# Patient Record
Sex: Male | Born: 1995 | Race: White | Hispanic: No | Marital: Single | Smoking: Current every day smoker
Health system: Southern US, Community
[De-identification: ages and names within clinical notes are randomized; demographics above are authoritative.]

## PROBLEM LIST (undated history)

## (undated) HISTORY — PX: FRACTURE SURGERY: SHX138

---
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
## 2014-11-20 ENCOUNTER — Ambulatory Visit: Payer: Self-pay | Admitting: Physician Assistant

## 2014-11-20 LAB — RAPID STREP-A WITH REFLX: MICRO TEXT REPORT: NEGATIVE

## 2014-11-23 LAB — BETA STREP CULTURE(ARMC)

## 2017-05-05 ENCOUNTER — Ambulatory Visit (INDEPENDENT_AMBULATORY_CARE_PROVIDER_SITE_OTHER): Payer: 59 | Admitting: Family Medicine

## 2017-05-05 ENCOUNTER — Encounter: Payer: Self-pay | Admitting: Family Medicine

## 2017-05-05 VITALS — BP 124/72 | HR 64 | Ht 74.0 in | Wt 220.0 lb

## 2017-05-05 DIAGNOSIS — R454 Irritability and anger: Secondary | ICD-10-CM | POA: Diagnosis not present

## 2017-05-05 MED ORDER — SERTRALINE HCL 25 MG PO TABS: 25.0000 mg | ORAL_TABLET | Freq: Every day | ORAL | 1 refills | Status: DC

## 2017-05-05 NOTE — Progress Notes (Addendum)
Name: Brent Valencia   MRN: [id number]    DOB: 07/24/1996   Date:05/05/2017       Progress Note  Subjective  Chief Complaint  Chief Complaint  Patient presents with  . Establish Care  . anger issues    "little things get under my skin and I can't calm down until something happens"- getting worse    Depression         The patient presents with no depression.  This is a new problem.  The current episode started more than 1 year ago.   The onset quality is undetermined.   The problem occurs intermittently.  The problem has been waxing and waning since onset.  Associated symptoms include decreased concentration, insomnia, irritable, restlessness, decreased interest and sad.  Associated symptoms include no fatigue, no helplessness, no hopelessness, no appetite change, no body aches, no myalgias, no headaches, no indigestion and no suicidal ideas.  Past treatments include SSRIs - Selective serotonin reuptake inhibitors.   Pertinent negatives include no thyroid problem, no anxiety, no depression, no mental health disorder, no obsessive-compulsive disorder and no post-traumatic stress disorder.   No problem-specific Assessment & Plan notes found for this encounter.   History reviewed. No pertinent past medical history.  Past Surgical History:  Procedure Laterality Date  . MANDIBLE FRACTURE SURGERY  2014    Family History  Problem Relation Age of Onset  . Stroke Maternal Grandmother   . Stroke Paternal Grandmother   . Cancer Paternal Grandfather   . Heart disease Paternal Grandfather     Social History   Social History  . Marital status: Single    Spouse name: N/A  . Number of children: N/A  . Years of education: N/A   Occupational History  . Not on file.   Social History Main Topics  . Smoking status: Current Every Day Smoker    Types: Cigarettes  . Smokeless tobacco: Never Used     Comment: info given  . Alcohol use Not on file  . Drug use: Unknown  . Sexual  activity: Not on file   Other Topics Concern  . Not on file   Social History Narrative  . No narrative on file    Not on File  No outpatient prescriptions prior to visit.   No facility-administered medications prior to visit.     Review of Systems  Constitutional: Negative for appetite change, chills, fatigue, fever, malaise/fatigue and weight loss.  HENT: Negative for ear discharge, ear pain and sore throat.   Eyes: Negative for blurred vision.  Respiratory: Negative for cough, sputum production, shortness of breath and wheezing.   Cardiovascular: Negative for chest pain, palpitations and leg swelling.  Gastrointestinal: Negative for abdominal pain, blood in stool, constipation, diarrhea, heartburn, melena and nausea.  Genitourinary: Negative for dysuria, frequency, hematuria and urgency.  Musculoskeletal: Negative for back pain, joint pain, myalgias and neck pain.  Skin: Negative for rash.  Neurological: Negative for dizziness, tingling, sensory change, focal weakness and headaches.  Endo/Heme/Allergies: Negative for environmental allergies and polydipsia. Does not bruise/bleed easily.  Psychiatric/Behavioral: Positive for decreased concentration and depression. Negative for suicidal ideas. The patient has insomnia. The patient is not nervous/anxious.      Objective  Vitals:   05/05/17 1446  BP: 124/72  Pulse: 64  Weight: 220 lb (99.8 kg)  Height: 6\' 2"  (1.88 m)    Physical Exam  Constitutional: He is oriented to person, place, and time and well-developed, well-nourished, and in no distress. He  is irritable.  HENT:  Head: Normocephalic.  Right Ear: External ear normal.  Left Ear: External ear normal.  Nose: Nose normal.  Mouth/Throat: Oropharynx is clear and moist.  Eyes: Pupils are equal, round, and reactive to light. Conjunctivae and EOM are normal. Right eye exhibits no discharge. Left eye exhibits no discharge. No scleral icterus.  Neck: Normal range of  motion. Neck supple. No JVD present. No tracheal deviation present. No thyromegaly present.  Cardiovascular: Normal rate, regular rhythm, normal heart sounds and intact distal pulses.  Exam reveals no gallop and no friction rub.   No murmur heard. Pulmonary/Chest: Breath sounds normal. No respiratory distress. He has no wheezes. He has no rales.  Abdominal: Soft. Bowel sounds are normal. He exhibits no mass. There is no hepatosplenomegaly. There is no tenderness. There is no rebound, no guarding and no CVA tenderness.  Musculoskeletal: Normal range of motion. He exhibits no edema or tenderness.  Lymphadenopathy:    He has no cervical adenopathy.  Neurological: He is alert and oriented to person, place, and time. He has normal sensation, normal strength, normal reflexes and intact cranial nerves. No cranial nerve deficit.  Skin: Skin is warm. No rash noted.  Psychiatric: Mood and affect normal.  Nursing note and vitals reviewed.     Assessment & Plan  Problem List Items Addressed This Visit    None    Visit Diagnoses    Difficulty controlling anger    -  Primary   Relevant Medications   sertraline (ZOLOFT) 25 MG tablet      Meds ordered this encounter  Medications  . sertraline (ZOLOFT) 25 MG tablet    Sig: Take 1 tablet (25 mg total) by mouth daily.    Dispense:  30 tablet    Refill:  1      Dr. Hayden [facility]  05/05/17

## 2017-06-02 ENCOUNTER — Ambulatory Visit (INDEPENDENT_AMBULATORY_CARE_PROVIDER_SITE_OTHER): Payer: 59 | Admitting: Family Medicine

## 2017-06-02 ENCOUNTER — Encounter: Payer: Self-pay | Admitting: Family Medicine

## 2017-06-02 VITALS — BP 118/78 | HR 56 | Ht 74.0 in | Wt 223.0 lb

## 2017-06-02 DIAGNOSIS — R454 Irritability and anger: Secondary | ICD-10-CM

## 2017-06-02 MED ORDER — SERTRALINE HCL 50 MG PO TABS: 50.0000 mg | ORAL_TABLET | Freq: Every day | ORAL | 3 refills | Status: DC

## 2017-06-02 NOTE — Progress Notes (Signed)
Name: Brent Valencia   MRN: [id number]    DOB: 09-23-1995   Date:06/02/2017       Progress Note  Subjective  Chief Complaint  Chief Complaint  Patient presents with  . Follow-up    anger- started sertraline  qday-  has not noticed any difference    Depression         The patient presents with no depression.  This is a recurrent (irratabilty) problem.  The current episode started more than 1 year ago.   The onset quality is gradual.   The problem has been gradually improving since onset.  Associated symptoms include irritable and sad.  Associated symptoms include no decreased concentration, no helplessness, no hopelessness, does not have insomnia, no decreased interest, no myalgias, no headaches and no suicidal ideas.     The symptoms are aggravated by work stress.  Past treatments include SSRIs - Selective serotonin reuptake inhibitors.  Compliance with treatment is good.  Previous treatment provided mild relief.   Pertinent negatives include no fibromyalgia, no anxiety, no depression, no obsessive-compulsive disorder and no post-traumatic stress disorder.   No problem-specific Assessment & Plan notes found for this encounter.   No past medical history on file.  Past Surgical History:  Procedure Laterality Date  . MANDIBLE FRACTURE SURGERY  2014    Family History  Problem Relation Age of Onset  . Stroke Maternal Grandmother   . Stroke Paternal Grandmother   . Cancer Paternal Grandfather   . Heart disease Paternal Grandfather     Social History   Social History  . Marital status: Single    Spouse name: N/A  . Number of children: N/A  . Years of education: N/A   Occupational History  . Not on file.   Social History Main Topics  . Smoking status: Current Every Day Smoker    Types: Cigarettes  . Smokeless tobacco: Never Used     Comment: info given  . Alcohol use Not on file  . Drug use: Unknown  . Sexual activity: Not on file   Other Topics Concern  . Not  on file   Social History Narrative  . No narrative on file    No Known Allergies  Outpatient Medications Prior to Visit  Medication Sig Dispense Refill  . sertraline (ZOLOFT) 25 MG tablet Take 1 tablet (25 mg total) by mouth daily. 30 tablet 1   No facility-administered medications prior to visit.     Review of Systems  Constitutional: Negative for chills, fever, malaise/fatigue and weight loss.  HENT: Negative for ear discharge, ear pain and sore throat.   Eyes: Negative for blurred vision.  Respiratory: Negative for cough, sputum production, shortness of breath and wheezing.   Cardiovascular: Negative for chest pain, palpitations and leg swelling.  Gastrointestinal: Negative for abdominal pain, blood in stool, constipation, diarrhea, heartburn, melena and nausea.  Genitourinary: Negative for dysuria, frequency, hematuria and urgency.  Musculoskeletal: Negative for back pain, joint pain, myalgias and neck pain.  Skin: Negative for rash.  Neurological: Negative for dizziness, tingling, sensory change, focal weakness and headaches.  Endo/Heme/Allergies: Negative for environmental allergies and polydipsia. Does not bruise/bleed easily.  Psychiatric/Behavioral: Positive for depression. Negative for decreased concentration and suicidal ideas. The patient is not nervous/anxious and does not have insomnia.      Objective  Vitals:   06/02/17 1548  BP: 118/78  Pulse: (!) 56  Weight: 223 lb (101.2 kg)  Height:  (1.88 m)    Physical Exam  Constitutional: He is oriented to person, place, and time and well-developed, well-nourished, and in no distress. He is irritable.  HENT:  Head: Normocephalic.  Right Ear: External ear normal.  Left Ear: External ear normal.  Nose: Nose normal.  Mouth/Throat: Oropharynx is clear and moist.  Eyes: Pupils are equal, round, and reactive to light. Conjunctivae and EOM are normal. Right eye exhibits no discharge. Left eye exhibits no  discharge. No scleral icterus.  Neck: Normal range of motion. Neck supple. No JVD present. No tracheal deviation present. No thyromegaly present.  Cardiovascular: Normal rate, regular rhythm, normal heart sounds and intact distal pulses.  Exam reveals no gallop and no friction rub.   No murmur heard. Pulmonary/Chest: Breath sounds normal. No respiratory distress. He has no wheezes. He has no rales.  Abdominal: Soft. Bowel sounds are normal. He exhibits no mass. There is no hepatosplenomegaly. There is no tenderness. There is no rebound, no guarding and no CVA tenderness.  Musculoskeletal: Normal range of motion. He exhibits no edema or tenderness.  Lymphadenopathy:    He has no cervical adenopathy.  Neurological: He is alert and oriented to person, place, and time. He has normal sensation, normal strength, normal reflexes and intact cranial nerves. No cranial nerve deficit.  Skin: Skin is warm. No rash noted.  Psychiatric: Mood and affect normal.  Nursing note and vitals reviewed.     Assessment & Plan  Problem List Items Addressed This Visit      Other   Difficulty controlling anger - Primary      Meds ordered this encounter  Medications  . sertraline (ZOLOFT) 50 MG tablet    Sig: Take 1 tablet (50 mg total) by mouth daily. One and a half  A day    Dispense:  30 tablet    Refill:  3      Dr. [facility]  06/02/17

## 2017-06-02 NOTE — Patient Instructions (Signed)
Intermittent Explosive Disorder  Intermittent explosive disorder is a mental illness. With this disorder, you have trouble controlling angry impulses. You repeatedly have sudden episodes of aggression (explosive rages). The rages are unplanned and out of proportion to the situation. They can result in serious injury to people and animals or damage to property. Intermittent explosive disorder may interfere with personal relationships, school, or work. It may cause legal or financial problems. Some people with intermittent explosive disorder feel guilty or embarrassed after a rage.  Intermittent explosive disorder is not diagnosed before age 6 years and most commonly starts in the teenage years. The disorder may go away after 10 or 20 years or may last for life. It is more common in men than women. This disorder is also more common in people with a history of physical abuse or with family members who have the disorder. With intermittent explosive disorder, you may often have one or more other mental disorders, including other impulse control disorders, depression, anxiety, and substance abuse. You are also at risk for self-injury and suicide attempts.  What are the signs or symptoms?  Explosive rages may be verbal, physical, or both. Examples of verbal rages may include:  · Temper tantrums after the age of 6 years.  · Verbal arguments or fights.  · Vicious name calling, cursing, or insulting others.  · Road rage.    Physical rages are directed at people, animals, or property. They may or may not cause injury or damage. Examples of physical aggression may include:  · Pushing, slapping, punching, choking, or kicking.  · Punching holes in walls, breaking furniture, or throwing items.    Verbal rages and nondamaging physical rages occur twice a week on average for 3 months or longer. Rages causing physical injury or property damage occur at least three times within a 12-month period.  Rages usually last less than 30  minutes. In between rages, you may have no signs of anger, or you may be irritable. Before or during an episode you may have one of the following sensations:  · Tingling.  · Tremors.  · Pounding heart.  · Chest tightness.  · Head pressure.  · Hearing an echo.    How is this diagnosed?  Intermittent explosive disorder is diagnosed through an assessment by your health care provider. He or she will ask questions about your rages and how they affect your life. Your health care provider may also ask about:  · Your mood.  · Your thoughts.  · Your other behaviors.  · Your medical history.  · Your use of medicines or drugs.    Your health care provider may do a physical exam and order lab tests or brain imaging studies. Similar symptoms may occur with a number of other mental illnesses, certain brain disorders, and the use of certain substances. Your health care provider may refer you to a mental health professional for evaluation.  How is this treated?  Treatment for intermittent explosive disorder is provided by mental health specialists. It includes the following options:  · Counseling or talk therapy. One form of talk therapy focuses on your underlying feelings and reasons that lead to rages. Another form of talk therapy looks at your triggers for rages and ways to prevent or control them.  · Support groups. These provide emotional support and advice from other people with anger management problems.  · Medicines. Several different types of medicine may help reduce the frequency and severity of explosive rages. These include certain   antidepressants, mood stabilizers, anticonvulsants, major tranquilizers (antipsychotics), and beta blockers.    The most effective treatment is usually a combination of counseling, group support, and medicine.  Follow these instructions at home:  · When possible, leave or avoid situations that upset you.  · Keep all follow-up visits as directed by your health care provider.  · Take medicines  only as directed by your health care provider.  Contact a health care provider if:  · Your symptoms get worse.  · You are not able to take your medicine as told.  Get help right away if:  · You have serious thoughts about hurting yourself or others.  · You have seriously injured yourself or someone else.  This information is not intended to replace advice given to you by your health care provider. Make sure you discuss any questions you have with your health care provider.  Document Released: 11/22/2007 Document Revised: 01/21/2016 Document Reviewed: 10/11/2013  Elsevier Interactive Patient Education © 2018 [employer]        Tips for Managing Your Anger  HOW CAN ANGER AFFECT MY LIFE?  Everyone feels angry from time to time. It is okay and normal to feel angry. However, the way that you behave or react to anger can make it a problem. If you react too strongly to anger or you cannot control your anger, that can cause relationships problems at home and work.  Anger can also affect your health. Uncontrolled anger increases your risk of heart disease. When you are angry, your heart rate and blood pressure rise. Levels of certain energy hormones, such as adrenaline, also increase. When this happens, your heart has to work harder. In extreme cases, anger can cause the blood vessels to become narrow. This reduces the supply of blood and oxygen to the heart, and that can trigger chest pain (angina).  Anger can also trigger stress-related problems, such as:  · Headaches.  · Poor digestion.  · Trouble sleeping (insomnia).    WHAT ACTIONS CAN I TAKE TO HELP MANAGE MY ANGER?  You can take actions to help you manage your anger. For example:  · Express your anger. When you express your anger in a healthy way, it is a form of communication. The following strategies can help you to express your anger in a healthy way and when you are ready to do so:  ? Step away. When you are feeling reactive, it may take at least 20 minutes for  your body to return to its normal blood pressure and heart rate. To help your body do this, take a walk, listen to music, stretch, take deep breaths, and avoid the situation or person who is making you angry. Try to only discuss your anger when you feel calm again.  ? Try to consider how others feel before you react. Avoid swearing, sighing, raising your voice, or blaming.  ? Choose a good time to work through problems. You may be more likely to lose your temper at the end of the day when you are tired.  ? Keep an anger journal. Writing down the situations that make you angry can help you figure out what triggers your anger and why.  · Consider changing your perception. Is there another way you can view the situation that will leave you with a different emotion? Sometimes, changing the way you think about a situation can make it seem less infuriating. Here are some ways to do that:  ? Remind yourself that everyone is   not out to get you.  ? Remind yourself that a disappointing result is not the end of the world.  ? Take steps to solve or prevent the situation that upsets you.  ? Find the humor in an aggravating situation.  ? Deal with the physical effects by taking deep breaths, exercising, or taking a walk.  ? Slowly repeat the word “relax” or another calming phrase.  ? Picture a relaxing image in your mind. Close your eyes and use that image to help you calm yourself.    WHEN SHOULD I SEEK ADDITIONAL HELP?  Anger becomes a problem if it occurs frequently and lasts for long periods of time. You may also need help managing your anger if:  · You use physical force or aggression when you are angry and others feel threatened and fearful.  · You feel that your anger is out of control.  · Anger is interfering with your job.  · Anger is causing problems with your health.  · Anger is causing problems with your relationships.  · Anger is affecting your ability to tolerate normal daily situations, such as sitting in a traffic  jam or waiting in line.  · You treat others disrespectfully.  · You do not trust people around you.    It may help to ask someone you trust whether he or she thinks you show any of these signs. Sometimes, it can be hard to recognize the problem yourself.  WHERE CAN I GET SUPPORT?  A psychologist or another licensed mental health professional can help you learn how to manage your anger. Ask your health care provider for a referral, or look online to find a psychologist who specializes in anger management. You can search the websites of many mental health organizations to find a mental health care provider. Local Domestic Abuse Projects are also available for help.  Your local hospital or behavioral counselors in your area may also offer anger management programs or support groups that can help.  WHERE CAN I FIND MORE INFORMATION?  · The [place]. Centers for Disease Control and Prevention: www.cdc.gov/bam/life/getting-along3.html  · American Psychological Association: www.apa.org/topics/anger/  · The Substance Abuse and Mental Health Services Administration: http://www.samhsa.gov/samhsaNewsLetter/Volume_22_Number_3/working_with_anger/  · National Resource Center on Domestic Violence: http://www.nrcdv.org/dvam/    This information is not intended to replace advice given to you by your health care provider. Make sure you discuss any questions you have with your health care provider.  Document Released: 06/12/2007 Document Revised: 04/17/2016 Document Reviewed: 06/19/2015  Elsevier Interactive Patient Education © 2018 [employer]

## 2017-06-27 ENCOUNTER — Other Ambulatory Visit: Payer: Self-pay

## 2017-06-27 MED ORDER — SERTRALINE HCL 50 MG PO TABS: 75.0000 mg | ORAL_TABLET | Freq: Every day | ORAL | 3 refills | Status: DC

## 2017-07-14 ENCOUNTER — Ambulatory Visit: Payer: 59 | Admitting: Family Medicine

## 2017-08-18 ENCOUNTER — Other Ambulatory Visit: Payer: Self-pay

## 2017-08-18 MED ORDER — SERTRALINE HCL 50 MG PO TABS: 75.0000 mg | ORAL_TABLET | Freq: Every day | ORAL | 0 refills | Status: DC

## 2017-08-31 MED ORDER — SERTRALINE HCL 50 MG PO TABS: 75.0000 mg | ORAL_TABLET | Freq: Every day | ORAL | 3 refills | Status: DC

## 2017-12-04 ENCOUNTER — Ambulatory Visit: Payer: 59 | Admitting: Family Medicine

## 2017-12-04 ENCOUNTER — Encounter: Payer: Self-pay | Admitting: Family Medicine

## 2017-12-04 VITALS — BP 120/80 | HR 80 | Ht 74.0 in | Wt 225.0 lb

## 2017-12-04 DIAGNOSIS — S93421D Sprain of deltoid ligament of right ankle, subsequent encounter: Secondary | ICD-10-CM

## 2017-12-04 DIAGNOSIS — R454 Irritability and anger: Secondary | ICD-10-CM | POA: Diagnosis not present

## 2017-12-04 MED ORDER — TRAMADOL HCL 50 MG PO TABS: 50.0000 mg | ORAL_TABLET | Freq: Four times a day (QID) | ORAL | 0 refills | Status: DC | PRN

## 2017-12-04 MED ORDER — SERTRALINE HCL 50 MG PO TABS: 75.0000 mg | ORAL_TABLET | Freq: Every day | ORAL | 5 refills | Status: DC

## 2017-12-04 NOTE — Progress Notes (Signed)
Name: Brent Valencia   MRN: [id number]    DOB: 1996/05/04   Date:12/04/2017       Progress Note  Subjective  Chief Complaint  Chief Complaint  Patient presents with  . Ankle Injury    R) ankle- fell down steps/ had an xray- determined no Fx just a sprain  . Depression    follow up on sertraline- "doing good"    Ankle Injury   The incident occurred 3 to 5 days ago. Incident location: friend's house. The injury mechanism was a fall. The pain is present in the right ankle. The quality of the pain is described as aching. The pain is at a severity of 7/10. The pain is moderate. The pain has been intermittent since onset. Associated symptoms include a loss of motion. Pertinent negatives include no inability to bear weight, loss of sensation, muscle weakness, numbness or tingling. The symptoms are aggravated by movement. The treatment provided moderate relief.  Depression         This is a new problem.  The current episode started more than 1 month ago.   The onset quality is gradual.   The problem occurs intermittently.  The problem has been gradually improving since onset.  Associated symptoms include no decreased concentration, no fatigue, no helplessness, no hopelessness, does not have insomnia, not irritable, no restlessness, no decreased interest, no appetite change, no body aches, no myalgias, no headaches, no indigestion, not sad and no suicidal ideas.     The symptoms are aggravated by nothing.  Past treatments include SSRIs - Selective serotonin reuptake inhibitors.  Compliance with treatment is good.  Past compliance problems include difficulty with treatment plan.   No problem-specific Assessment & Plan notes found for this encounter.   History reviewed. No pertinent past medical history.  Past Surgical History:  Procedure Laterality Date  . MANDIBLE FRACTURE SURGERY  2014    Family History  Problem Relation Age of Onset  . Stroke Maternal Grandmother   . Stroke Paternal  Grandmother   . Cancer Paternal Grandfather   . Heart disease Paternal Grandfather     Social History   Socioeconomic History  . Marital status: Single    Spouse name: Not on file  . Number of children: Not on file  . Years of education: Not on file  . Highest education level: Not on file  Occupational History  . Not on file  Social Needs  . Financial resource strain: Not on file  . Food insecurity:    Worry: Not on file    Inability: Not on file  . Transportation needs:    Medical: Not on file    Non-medical: Not on file  Tobacco Use  . Smoking status: Current Every Day Smoker    Types: Cigarettes  . Smokeless tobacco: Never Used  . Tobacco comment: info given  Substance and Sexual Activity  . Alcohol use: Not on file  . Drug use: Not on file  . Sexual activity: Not on file  Lifestyle  . Physical activity:    Days per week: Not on file    Minutes per session: Not on file  . Stress: Not on file  Relationships  . Social connections:    Talks on phone: Not on file    Gets together: Not on file    Attends religious service: Not on file    Active member of club or organization: Not on file    Attends meetings of clubs or organizations: Not  on file    Relationship status: Not on file  . Intimate partner violence:    Fear of current or ex partner: Not on file    Emotionally abused: Not on file    Physically abused: Not on file    Forced sexual activity: Not on file  Other Topics Concern  . Not on file  Social History Narrative  . Not on file    No Known Allergies  Outpatient Medications Prior to Visit  Medication Sig Dispense Refill  . sertraline (ZOLOFT) 50 MG tablet Take 1.5 tablets (75 mg total) by mouth daily. 45 tablet 3   No facility-administered medications prior to visit.     Review of Systems  Constitutional: Negative for appetite change, chills, fatigue, fever, malaise/fatigue and weight loss.  HENT: Negative for ear discharge, ear pain and sore  throat.   Eyes: Negative for blurred vision.  Respiratory: Negative for cough, sputum production, shortness of breath and wheezing.   Cardiovascular: Negative for chest pain, palpitations and leg swelling.  Gastrointestinal: Negative for abdominal pain, blood in stool, constipation, diarrhea, heartburn, melena and nausea.  Genitourinary: Negative for dysuria, frequency, hematuria and urgency.  Musculoskeletal: Negative for back pain, joint pain, myalgias and neck pain.  Skin: Negative for rash.  Neurological: Negative for dizziness, tingling, sensory change, focal weakness, numbness and headaches.  Endo/Heme/Allergies: Negative for environmental allergies and polydipsia. Does not bruise/bleed easily.  Psychiatric/Behavioral: Positive for depression. Negative for decreased concentration and suicidal ideas. The patient is not nervous/anxious and does not have insomnia.      Objective  Vitals:   12/04/17 1551  BP: 120/80  Pulse: 80  Weight: 225 lb (102.1 kg)  Height: 6\' 2"  (1.88 m)    Physical Exam  Constitutional: He is oriented to person, place, and time and well-developed, well-nourished, and in no distress. He is not irritable.  HENT:  Head: Normocephalic.  Right Ear: External ear normal.  Left Ear: External ear normal.  Nose: Nose normal.  Mouth/Throat: Oropharynx is clear and moist.  Eyes: Pupils are equal, round, and reactive to light. Conjunctivae and EOM are normal. Right eye exhibits no discharge. Left eye exhibits no discharge. No scleral icterus.  Neck: Normal range of motion. Neck supple. No JVD present. No tracheal deviation present. No thyromegaly present.  Cardiovascular: Normal rate, regular rhythm, normal heart sounds and intact distal pulses. Exam reveals no gallop and no friction rub.  No murmur heard. Pulmonary/Chest: Breath sounds normal. No respiratory distress. He has no wheezes. He has no rales.  Abdominal: Soft. Bowel sounds are normal. He exhibits no mass.  There is no hepatosplenomegaly. There is no tenderness. There is no rebound, no guarding and no CVA tenderness.  Musculoskeletal: Normal range of motion. He exhibits no edema.       Right ankle: He exhibits swelling and ecchymosis. He exhibits normal range of motion, no deformity, no laceration and normal pulse. Tenderness. Lateral malleolus, AITFL and CF ligament tenderness found.  Mild tenderness deltoid  Lymphadenopathy:    He has no cervical adenopathy.  Neurological: He is alert and oriented to person, place, and time. He has normal sensation, normal strength, normal reflexes and intact cranial nerves. No cranial nerve deficit.  Skin: Skin is warm. No rash noted.  Psychiatric: Mood and affect normal.  Nursing note and vitals reviewed.     Assessment & Plan  Problem List Items Addressed This Visit      Other   Difficulty controlling anger - Primary  Relevant Medications   sertraline (ZOLOFT) 50 MG tablet    Other Visit Diagnoses    Sprain of right medial ankle joint, subsequent encounter       Relevant Medications   traMADol (ULTRAM) 50 MG tablet      Meds ordered this encounter  Medications  . traMADol (ULTRAM) 50 MG tablet    Sig: Take 1 tablet (50 mg total) by mouth every 6 (six) hours as needed.    Dispense:  20 tablet    Refill:  0  . sertraline (ZOLOFT) 50 MG tablet    Sig: Take 1.5 tablets (75 mg total) by mouth daily.    Dispense:  45 tablet    Refill:  5      Dr. [facility]uereanna Jones [facility]  12/04/17

## 2018-04-12 ENCOUNTER — Other Ambulatory Visit: Payer: Self-pay | Admitting: Family Medicine

## 2018-04-12 DIAGNOSIS — R454 Irritability and anger: Secondary | ICD-10-CM

## 2019-02-19 ENCOUNTER — Ambulatory Visit: Payer: 59 | Admitting: Family Medicine

## 2019-02-19 ENCOUNTER — Encounter: Payer: Self-pay | Admitting: Family Medicine

## 2019-02-19 ENCOUNTER — Other Ambulatory Visit: Payer: Self-pay

## 2019-02-19 VITALS — BP 138/80 | HR 88 | Ht 74.0 in | Wt 222.0 lb

## 2019-02-19 DIAGNOSIS — A630 Anogenital (venereal) warts: Secondary | ICD-10-CM

## 2019-02-19 MED ORDER — PODOFILOX 0.5 % EX SOLN: Freq: Two times a day (BID) | CUTANEOUS | 2 refills | Status: AC

## 2019-02-19 NOTE — Patient Instructions (Addendum)
Human Papillomavirus Human papillomavirus (HPV) is the most common sexually transmitted infection (STI). It easily spreads from person to person (is highly contagious). HPV infections cause genital warts. Certain types of HPV may cause cancers, including cancer of the lower part of the uterus (cervix), vagina, outer male genital area (vulva), penis, anus, and rectum. HPV may also cause cancers of the oral cavity, such as the throat, tongue, and tonsils. There are many types of HPV. It usually does not cause symptoms. However, sometimes there are wart-like lesions in the throat or warts in the genital area that you can see or feel. It is possible to be infected for long periods and pass HPV to others without knowing it. What are the causes? HPV is caused by a virus that spreads from person to person through sexual contact. This includes oral, vaginal, or anal sex. What increases the risk? The following factors may make you more likely to develop this condition:  Having unprotected oral, vaginal, or anal sex.  Having several sex partners.  Having a sex partner who has other sex partners.  Having or having had another STI.  Having a weak disease-fighting (immune) system.  Having damaged skin in the genital area. What are the signs or symptoms? Most people who have HPV do not have any symptoms. If symptoms are present, they may include:  Wartlike lesions in the throat (from having oral sex).  Warts on the infected skin or mucous membranes.  Genital warts that may itch, burn, bleed, or be painful during sexual intercourse. How is this diagnosed? If wartlike lesions are present in the throat or if genital warts are present, your health care provider can usually diagnose HPV with a physical exam. Genital warts are easily seen. In females, tests may be used to diagnose HPV, including:  A Pap test. A Pap test takes a sample of cells from your cervix to check for cancer and HPV infection.  An  HPV test. This is similar to a Pap test and involves taking a sample of cells from your cervix.  Using a scope to view the cervix (colposcopy). This may be done if a pelvic exam or Pap test is abnormal. A sample of tissue may be removed for testing (biopsy) during the colposcopy. Currently, there is no test to detect HPV in males. How is this treated? There is no treatment for the virus itself. However, there are treatments for the health problems and symptoms HPV can cause. Your health care provider will monitor you closely after you are treated as HPV can come back and may need treatment again. Treatment for HPV may include:  Medicines, which may be injected or applied to genital warts in a cream, lotion, liquid or gel form.  Use of a probe to apply extreme cold (cryotherapy) to the genital warts.  Application of an intense beam of light (laser treatment) on the genital warts.  Use of a probe to apply extreme heat (electrocautery) on the genital warts.  Surgery to remove the genital warts. Follow these instructions at home: Medicines  Take over-the-counter and prescription medicines only as told by your health care provider. This include creams for itching or irritation.  Do not treat genital warts with medicines used for treating hand warts. General instructions  Do not touch or scratch the warts.  Do not have sex while you are being treated.  Do not douche or use tampons during treatment (women).  Tell your sex partner about your infection. He or she may  also need to be treated.  If you become pregnant, tell your health care provider that you have HPV. Your health care provider will monitor you closely during pregnancy to make sure your baby is safe.  Keep all follow-up visits as told by your health care provider. This is important. How is this prevented?  Talk with your health care provider about getting the HPV vaccines. These vaccines prevent some HPV infections and  cancers. The vaccines are recommended for males and females between the ages of 839 and 7726. They will not work if you already have HPV, and they are not recommended for pregnant women.  After treatment, use condoms during sex to prevent future infections.  Have only one sex partner.  Have a sex partner who does not have other sex partners.  Get regular Pap tests as directed by your health care provider. Contact a health care provider if:  The treated skin becomes red, swollen, or painful.  You have a fever.  You feel generally ill.  You feel lumps or pimples sticking out in and around your genital area.  You develop bleeding of the vagina or the treatment area.  You have painful sexual intercourse. Summary  Human papillomavirus (HPV) is the most common sexually transmitted infection (STI) and is highly contagious.  Most people carrying HPV do not have any symptoms.  HPV can be prevented with vaccination. The vaccine is recommended for males and females between the ages of 299 and 5026.  There is no treatment for the virus itself. However, there are treatments for the health problems and symptoms HPV can cause. This information is not intended to replace advice given to you by your health care provider. Make sure you discuss any questions you have with your health care provider. Document Released: 11/05/2003 Document Revised: 07/24/2016 Document Reviewed: 07/24/2016 Elsevier Interactive Patient Education  2019 [employer] Podofilox topical solution What is this medicine? PODOFILOX (po do FIL ox) is a medication used to remove genital warts. The topical solution should only be used to treat genital warts located on the outside skin of the genitals (i.e., penis or vagina). The solution should not be used to treat warts near the rectal area. This medicine may be used for other purposes; ask your health care provider or pharmacist if you have questions. COMMON BRAND NAME(S): Condylox  What should I tell my health care provider before I take this medicine? They need to know if you have any of these conditions: -an unusual or allergic reaction to podofilox, podophyllum resin, other medicines, foods, dyes or preservatives -pregnant or trying to get pregnant -breast-feeding How should I use this medicine? This medicine is for external use only on the skin. Do not take by mouth. The solution may be used on the outside (external) areas of the skin around the vagina or penis to remove warts. The solution should not be used in the area between the rectum and the genitals. The solution should only be used on the outside skin. It should not be used to treat warts that are inside the rectum, vagina, or penis. Do not use this medicine more often or for longer than directed. Do not use a larger dose than directed by your health care professional. Wash hands before and after use. Read package directions carefully before using. Apply the medication to the specific wart as instructed by your physician. Apply the solution using the applicator provided or a cotton-tipped applicator. Applicators should not be re-used. Additionally, used applicators should  not be dipped into the bottle to prevent contamination. After application, you should make sure that the medication is dry before normal, untreated skin comes into contact with the treated skin. This medicine can cause severe irritation of normal skin. If contact with normal skin occurs, immediately flush the area thoroughly with water. Talk to your pediatrician regarding the use of this medicine in children. Special care may be needed. Overdosage: If you think you have taken too much of this medicine contact a poison control center or emergency room at once. NOTE: This medicine is only for you. Do not share this medicine with others. What if I miss a dose? If you miss a dose, take it as soon as you can. If it is almost time for your next dose, take  only that dose. Do not take double or extra doses. What may interact with this medicine? Interactions are not expected. Do not use any other medicines on the affected area without asking your doctor or health care professional. This list may not describe all possible interactions. Give your health care provider a list of all the medicines, herbs, non-prescription drugs, or dietary supplements you use. Also tell them if you smoke, drink alcohol, or use illegal drugs. Some items may interact with your medicine. What should I watch for while using this medicine? Visit your doctor or health care professional for regular checks on your progress. This medicine is not a cure. New warts may develop during or after treatment. Tell your doctor or health care professional if your symptoms do not start to get better within one week. The weekly treatment course can be repeated up to 4 times. If the wart does not resolve in 4 weeks, a different treatment should be considered. If you are pregnant or think you might be pregnant, contact your doctor or health care professional. Sexual (genital, oral, anal) contact should be avoided while the medication is on the skin. The only way to prevent infecting others with the HPV virus (the virus that causes genital warts) is to avoid direct skin-to-skin contact. If warts are visible in the genital area, sexual contact should be avoided until the warts are treated. Experts advise that using latex condoms during sexual contact may reduce, but not entirely prevent, infecting others. Avoid contact with the eyes. If eye contact occurs, patients should immediately flush the eye with large quantities of water for 15 minutes and seek medical attention. This medicine contains alcohol and is flammable. Do not use near heat, open flame, or while smoking. What side effects may I notice from receiving this medicine? Side effects that you should report to your doctor or health care  professional as soon as possible: -bleeding, blistering, burning, crusting, or scabbing of treated skin -blood in the urine -dizziness -severe skin rash or swelling -vomiting (may indicate excessive dosage) Side effects that usually do not require medical attention (report to your doctor or health care professional if they continue or are bothersome): -dryness, flaking or peeling of the skin -headache -mild redness, itching or stinging of the skin This list may not describe all possible side effects. Call your doctor for medical advice about side effects. You may report side effects to FDA at 1-800-FDA-1088. Where should I keep my medicine? Keep out of the reach of children. Store at room temperature between 15 to 30 degrees C (59 to 86 degrees F). Do not freeze. Keep container tightly closed. This medicine contains alcohol and is flammable. Do not store near heat or  open flame. Throw away any unused medicine after the expiration date. NOTE: This sheet is a summary. It may not cover all possible information. If you have questions about this medicine, talk to your doctor, pharmacist, or health care provider.  2019 Elsevier/Gold Standard (2008-03-17 16:13:34) Genital Warts Genital warts are a common STD (sexually transmitted disease). They may appear as small bumps on the skin of the genital and anal areas. They sometimes become irritated and cause pain. Genital warts are easily passed to other people through sexual contact. Many people do not know that they are infected. They may be infected for years without symptoms. However, even if they do not have symptoms, they can pass the infection to their sexual partners. Getting treatment is important because genital warts can lead to other problems. In females, the virus that causes genital warts may increase the risk for cervical cancer. What are the causes? This condition is caused by a virus that is called human papillomavirus (HPV). HPV is spread  by having unprotected sex with an infected person. It can be spread through vaginal, anal, and oral sex. What increases the risk? You are more likely to develop this condition if:  You have unprotected sex.  You have multiple sexual partners.  You are sexually active before age 916.  You are a man who isnot circumcised.  You have a male sexual partner who is not circumcised.  You have a weakened body defense system (immune system) due to disease or medicine.  You smoke. What are the signs or symptoms? Symptoms of this condition include:  Small growths in the genital area or anal area. These warts often grow in clusters.  Itching and irritation in the genital area or anal area.  Bleeding from the warts.  Pain during sex. How is this diagnosed? This condition is diagnosed based on:  Your symptoms.  A physical exam. You may also have other tests, including:  Biopsy. A tissue sample is removed so it can be checked under a microscope.  Colposcopy. In females, a magnifying tool is used to examine the vagina and cervix. Certain solutions may be used to make the HPV cells change color so they can be seen more easily.  A Pap test in females.  Tests for other STDs. How is this treated? This condition may be treated with:  Medicines, such as: ? Solutions or creams applied to your skin (topical).  Procedures, such as: ? Freezing the warts with liquid nitrogen (cryotherapy). ? Burning the warts with a laser or electric probe (electrocautery). ? Surgery to remove the warts. Follow these instructions at home: Medicines   Apply over-the-counter and prescription medicines only as told by your health care provider.  Do not treat genital warts with medicines that are used for treating hand warts.  Talk with your health care provider about using over-the-counter anti-itch creams. Instructions for women  Get screened regularly for cervical cancer. Women who have genital  warts are at an increased risk for this cancer. This type of cancer is slow growing and can be cured if it is found early.  If you become pregnant, tell your health care provider that you have had an HPV infection. Your health care provider will monitor you closely during pregnancy to be sure that your baby is safe. General instructions  Do not touch or scratch the warts.  Do not have sex until your treatment has been completed.  Tell your current and past sexual partners about your condition because they may  also need treatment.  After treatment, use condoms during sex to prevent future infections.  Keep all follow-up visits as told by your health care provider. This is important. How is this prevented? Talk with your health care provider about getting the HPV vaccine. The vaccine:  Can prevent some HPV infections and cancers.  Is recommended for males and females who are 4611-340 years old.  Is not recommended for pregnant women.  Will not work if you already have HPV. Contact a health care provider if you:  Have redness, swelling, or pain in the area of the treated skin.  Have a fever.  Feel generally ill.  Feel lumps in and around your genital or anal area.  Have bleeding in your genital or anal area.  Have pain during sex. Summary  Genital warts are a common STD (sexually transmitted disease). It may appear as small bumps on the genital and anal areas.  This condition is caused by a virus that is called human papillomavirus (HPV). HPV is spread by having unprotected sex with an infected person. It can be spread through vaginal, anal, and oral sex.  Treatment is important because genital warts can lead to other problems. In females, the virus that causes genital warts may increase the risk for cervical cancer.  This condition may be treated with medicine that is applied to the skin, or procedures to remove the warts.  The HPV vaccine can prevent some HPV infections and  cancers. It is recommended that the vaccine be given to males and females who are 6511-24 years old. This information is not intended to replace advice given to you by your health care provider. Make sure you discuss any questions you have with your health care provider. Document Released: 08/12/2000 Document Revised: 09/19/2017 Document Reviewed: 09/19/2017 Elsevier Interactive Patient Education  2019 [employer]

## 2019-02-19 NOTE — Progress Notes (Signed)
Date:  02/19/2019   Name:  Brent Valencia   DOB:  [birth year]   MRN:  [id number]   Chief Complaint: Rash (itchy, red bumps x 2 weeks- no discharge/ unprotected sex)  Rash This is a new problem. The current episode started 1 to 4 weeks ago (2 weeks). The problem is unchanged (not getting better). Location: on foreskin. The rash is characterized by redness and itchiness. Associated symptoms include a fever. Pertinent negatives include no cough, diarrhea, shortness of breath or sore throat. (No discharge) Past treatments include nothing. The treatment provided no relief.    Review of Systems  Constitutional: Positive for fever. Negative for chills.  HENT: Negative for drooling, ear discharge, ear pain and sore throat.   Respiratory: Negative for cough, shortness of breath and wheezing.   Cardiovascular: Negative for chest pain, palpitations and leg swelling.  Gastrointestinal: Negative for abdominal pain, blood in stool, constipation, diarrhea and nausea.  Endocrine: Negative for polydipsia.  Genitourinary: Positive for genital sores and penile pain. Negative for discharge, dysuria, frequency, hematuria, penile swelling, scrotal swelling, testicular pain and urgency.  Musculoskeletal: Negative for back pain, myalgias and neck pain.  Skin: Positive for rash.  Allergic/Immunologic: Negative for environmental allergies.  Neurological: Negative for dizziness and headaches.  Hematological: Does not bruise/bleed easily.  Psychiatric/Behavioral: Negative for suicidal ideas. The patient is not nervous/anxious.     Patient Active Problem List   Diagnosis Date Noted  . Difficulty controlling anger 06/02/2017    No Known Allergies  Past Surgical History:  Procedure Laterality Date  . MANDIBLE FRACTURE SURGERY  2014    Social History   Tobacco Use  . Smoking status: Current Every Day Smoker    Types: Cigarettes  . Smokeless tobacco: Never Used  . Tobacco comment: info given   Substance Use Topics  . Alcohol use: Not on file  . Drug use: Not on file     Medication list has been reviewed and updated.  No outpatient medications have been marked as taking for the 02/19/19 encounter (Office Visit) with Juline Patch, MD.    [facility] 2/9 Scores 06/02/2017 05/05/2017  PHQ - 2 Score 3 3  PHQ- 9 Score 5 5    BP Readings from Last 3 Encounters:  02/19/19 138/80  12/04/17 120/80  06/02/17 118/78    Physical Exam Vitals signs and nursing note reviewed.  HENT:     Head: Normocephalic.     Right Ear: External ear normal.     Left Ear: External ear normal.     Nose: Nose normal.  Eyes:     General: No scleral icterus.       Right eye: No discharge.        Left eye: No discharge.     Conjunctiva/sclera: Conjunctivae normal.     Pupils: Pupils are equal, round, and reactive to light.  Neck:     Musculoskeletal: Normal range of motion and neck supple.     Thyroid: No thyromegaly.     Vascular: No JVD.     Trachea: No tracheal deviation.  Cardiovascular:     Rate and Rhythm: Normal rate and regular rhythm.     Heart sounds: Normal heart sounds. No murmur. No friction rub. No gallop.   Pulmonary:     Effort: No respiratory distress.     Breath sounds: Normal breath sounds. No wheezing or rales.  Abdominal:     General: Bowel sounds are normal.     Palpations: Abdomen  is soft. There is no mass.     Tenderness: There is no abdominal tenderness. There is no guarding or rebound.  Musculoskeletal: Normal range of motion.        General: No tenderness.  Lymphadenopathy:     Cervical: No cervical adenopathy.  Skin:    General: Skin is warm.     Findings: Rash present. Rash is papular.     Comments: Papules in foreskin  Neurological:     Mental Status: He is alert and oriented to person, place, and time.     Cranial Nerves: No cranial nerve deficit.     Deep Tendon Reflexes: Reflexes are normal and symmetric.     Wt Readings from Last 3 Encounters:   02/19/19 222 lb (100.7 kg)  12/04/17 225 lb (102.1 kg)  06/02/17 223 lb (101.2 kg)    BP 138/80   Pulse 88   Ht 6\' 2"  (1.88 m)   Wt 222 lb (100.7 kg)   BMI 28.50 kg/m   Assessment and Plan: 1. Condyloma acuminata Acute.  Persistent.  Feels around the crease of the glans.  Consistent with HPV condyloma.  Will treat current condition with podofilox 0.5% solution applied twice a day 1 to 2 weeks.  It was discussed with patient about Gardasil and patient is going to consider initiating immunization series. - podofilox (CONDYLOX) 0.5 % external solution; Apply topically 2 (two) times daily.  Dispense: 3.5 mL; Refill: 2

## 2019-08-19 ENCOUNTER — Other Ambulatory Visit: Payer: Self-pay

## 2019-08-19 ENCOUNTER — Emergency Department: Payer: No Typology Code available for payment source

## 2019-08-19 ENCOUNTER — Emergency Department
Admission: EM | Admit: 2019-08-19 | Discharge: 2019-08-19 | Disposition: A | Discharge status: Home / Self Care | Payer: No Typology Code available for payment source | Attending: Emergency Medicine | Admitting: Emergency Medicine

## 2019-08-19 DIAGNOSIS — Y9389 Activity, other specified: Secondary | ICD-10-CM | POA: Diagnosis not present

## 2019-08-19 DIAGNOSIS — Z23 Encounter for immunization: Secondary | ICD-10-CM | POA: Diagnosis not present

## 2019-08-19 DIAGNOSIS — Y990 Civilian activity done for income or pay: Secondary | ICD-10-CM | POA: Diagnosis not present

## 2019-08-19 DIAGNOSIS — F17210 Nicotine dependence, cigarettes, uncomplicated: Secondary | ICD-10-CM | POA: Insufficient documentation

## 2019-08-19 DIAGNOSIS — S61011A Laceration without foreign body of right thumb without damage to nail, initial encounter: Secondary | ICD-10-CM | POA: Insufficient documentation

## 2019-08-19 DIAGNOSIS — W268XXA Contact with other sharp object(s), not elsewhere classified, initial encounter: Secondary | ICD-10-CM | POA: Insufficient documentation

## 2019-08-19 DIAGNOSIS — Y9289 Other specified places as the place of occurrence of the external cause: Secondary | ICD-10-CM | POA: Insufficient documentation

## 2019-08-19 MED ORDER — TETANUS-DIPHTH-ACELL PERTUSSIS 5-2.5-18.5 LF-MCG/0.5 IM SUSP
0.5000 mL | Freq: Once | INTRAMUSCULAR | Status: AC
  Administered 2019-08-19: 0.5 mL via INTRAMUSCULAR
  Filled 2019-08-19: qty 0.5

## 2019-08-19 MED ORDER — LIDOCAINE HCL (PF) 1 % IJ SOLN
5.0000 mL | Freq: Once | INTRAMUSCULAR | Status: AC
  Administered 2019-08-19: 5 mL
  Filled 2019-08-19: qty 5

## 2019-08-19 MED ORDER — PENTAFLUOROPROP-TETRAFLUOROETH EX AERO
1.0000 "application " | INHALATION_SPRAY | CUTANEOUS | Status: DC | PRN
  Administered 2019-08-19: 1 via TOPICAL

## 2019-08-19 NOTE — ED Notes (Signed)
Contacted Ricky from [employer] and reports there is no screening to be completed for [employer] comp to be filed

## 2019-08-19 NOTE — Discharge Instructions (Signed)
Keep the wound clean, dry, and covered. Wash regularly with soap & water only. See your provider in 10-12 days for suture removal.

## 2019-08-19 NOTE — ED Notes (Signed)
See triage - bleeding controlled.

## 2019-08-19 NOTE — ED Provider Notes (Signed)
[facility] Emergency Department Provider Note ____________________________________________  Time seen: 1334  I have reviewed the triage vital signs and the nursing notes.  HISTORY  Chief Complaint  Laceration   HPI Brent Valencia is a 23 y.o. male presents to the ED for evaluation of work-related injury.  Patient describes accidentally cut his  right thumb while he was removing oven door today.  He presents to the ED for evaluation of his symptoms.  He reports an out of date tetanus status.  No other injury reported at this time.  History reviewed. No pertinent past medical history.  Patient Active Problem List   Diagnosis Date Noted  . Difficulty controlling anger 06/02/2017    Past Surgical History:  Procedure Laterality Date  . FRACTURE SURGERY     jaw  . MANDIBLE FRACTURE SURGERY  2014    Prior to Admission medications   Medication Sig Start Date End Date Taking? Authorizing Provider  podofilox (CONDYLOX) 0.5 % external solution Apply topically 2 (two) times daily. 02/19/19   Juline Patch, MD    Allergies Patient has no known allergies.  Family History  Problem Relation Age of Onset  . Stroke Maternal Grandmother   . Stroke Paternal Grandmother   . Cancer Paternal Grandfather   . Heart disease Paternal Grandfather     Social History Social History   Tobacco Use  . Smoking status: Current Every Day Smoker    Types: Cigarettes  . Smokeless tobacco: Never Used  . Tobacco comment: info given  Substance Use Topics  . Alcohol use: Yes  . Drug use: Not Currently    Review of Systems  Constitutional: Negative for fever. Cardiovascular: Negative for chest pain. Respiratory: Negative for shortness of breath. Musculoskeletal: Negative for back pain. Skin: Negative for rash.  Right thumb laceration as above. Neurological: Negative for headaches, focal weakness or  numbness. ____________________________________________  PHYSICAL EXAM:  VITAL SIGNS: ED Triage Vitals  Enc Vitals Group     BP 08/19/19 1256 (!) 110/58     Pulse Rate 08/19/19 1256 (!) 116     Resp 08/19/19 1256 18     Temp 08/19/19 1256 98.2 F (36.8 C)     Temp Source 08/19/19 1256 Oral     SpO2 08/19/19 1256 99 %     Weight 08/19/19 1258 245 lb (111.1 kg)     Height 08/19/19 1258 6\' 3"  (1.905 m)     Head Circumference --      Peak Flow --      Pain Score 08/19/19 1258 7     Pain Loc --      Pain Edu? --      Excl. in [place]? --     Constitutional: Alert and oriented. Well appearing and in no distress. Head: Normocephalic and atraumatic. Eyes: Conjunctivae are normal. Normal extraocular movements Cardiovascular: Normal rate, regular rhythm. Normal distal pulses. Respiratory: Normal respiratory effort. No wheezes/rales/rhonchi. Musculoskeletal: Nontender with normal range of motion in all extremities.  Neurologic:  Normal gait without ataxia. Normal speech and language. No gross focal neurologic deficits are appreciated. Skin:  Skin is warm, dry and intact. No rash noted. Psychiatric: Mood and affect are normal. Patient exhibits appropriate insight and judgment. ____________________________________________   RADIOLOGY  DG Right Thumb  Negative ____________________________________________  PROCEDURES  Tdap 0.5 ml IM .Marland KitchenLaceration Repair  Date/Time: 08/19/2019 1:31 PM Performed by: Melvenia Needles, PA-C Authorized by: Melvenia Needles, PA-C   Consent:  Consent obtained:  Verbal   Consent given by:  Patient   Risks discussed:  Pain and poor wound healing   Alternatives discussed:  No treatment Anesthesia (see MAR for exact dosages):    Anesthesia method:  Nerve block   Block needle gauge:  27 G   Block anesthetic:  Lidocaine 1% w/o epi   Block injection procedure:  Anatomic landmarks identified, anatomic landmarks palpated and introduced needle    Block outcome:  Anesthesia achieved Laceration details:    Location:  Finger   Finger location:  R thumb   Length (cm):  1   Depth (mm):  3 Repair type:    Repair type:  Simple Pre-procedure details:    Preparation:  Patient was prepped and draped in usual sterile fashion Exploration:    Contaminated: no   Treatment:    Area cleansed with:  Betadine   Amount of cleaning:  Standard Skin repair:    Repair method:  Sutures   Suture size:  4-0   Suture material:  Nylon   Suture technique:  Simple interrupted   Number of sutures:  3 Approximation:    Approximation:  Close Post-procedure details:    Dressing:  Non-adherent dressing   Patient tolerance of procedure:  Tolerated well, no immediate complications  ____________________________________________  INITIAL IMPRESSION / ASSESSMENT AND PLAN / ED COURSE  Patient with ED evaluation of accidental laceration to the right thumb while at work.  Patient's x-ray is negative for any underlying fracture or dislocation.  Superficial wound is repaired using sutures.  Patient is discharged with wound care instructions.  He will see his primary provider or the medical provider approved by his company for suture removal in 10 to 12 days.  Brent Valencia was evaluated in Emergency Department on 08/19/2019 for the symptoms described in the history of present illness. He was evaluated in the context of the global COVID-19 pandemic, which necessitated consideration that the patient might be at risk for infection with the SARS-CoV-2 virus that causes COVID-19. Institutional protocols and algorithms that pertain to the evaluation of patients at risk for COVID-19 are in a state of rapid change based on information released by regulatory bodies including the CDC and federal and state organizations. These policies and algorithms were followed during the patient's care in the ED. ____________________________________________  FINAL CLINICAL  IMPRESSION(S) / ED DIAGNOSES  Final diagnoses:  Laceration of right thumb without foreign body without damage to nail, initial encounter      Lissa Hoard, PA-C 08/19/19 1532    Minna Antis, MD 08/19/19 1947

## 2019-08-19 NOTE — ED Triage Notes (Signed)
Pt states he cut his right thumb while removing an oven door today; WC case.

## 2019-08-26 ENCOUNTER — Telehealth: Payer: Self-pay | Admitting: Family Medicine

## 2019-08-26 NOTE — Telephone Encounter (Signed)
Mom called in to see if we can do a 3 stitch removal on a finger. Patient was seen in the ER on the 21st.

## 2020-05-30 IMAGING — DX DG FINGER THUMB 2+V*R*
3 series · 3 of 3 positions shown · non-contrast
Comparison: None.

CLINICAL DATA: Laceration

EXAM:
RIGHT THUMB 2+V

[finger ap]
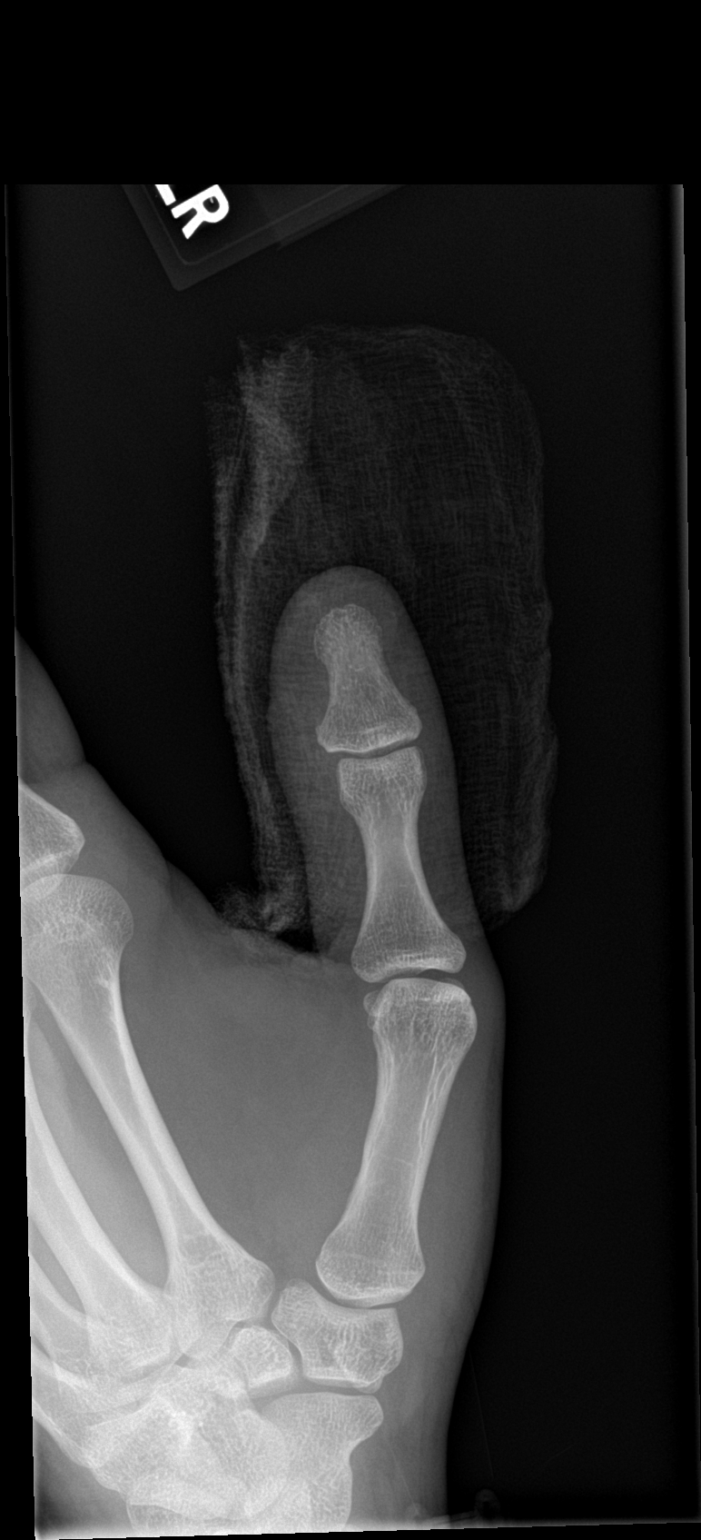

[finger obl]
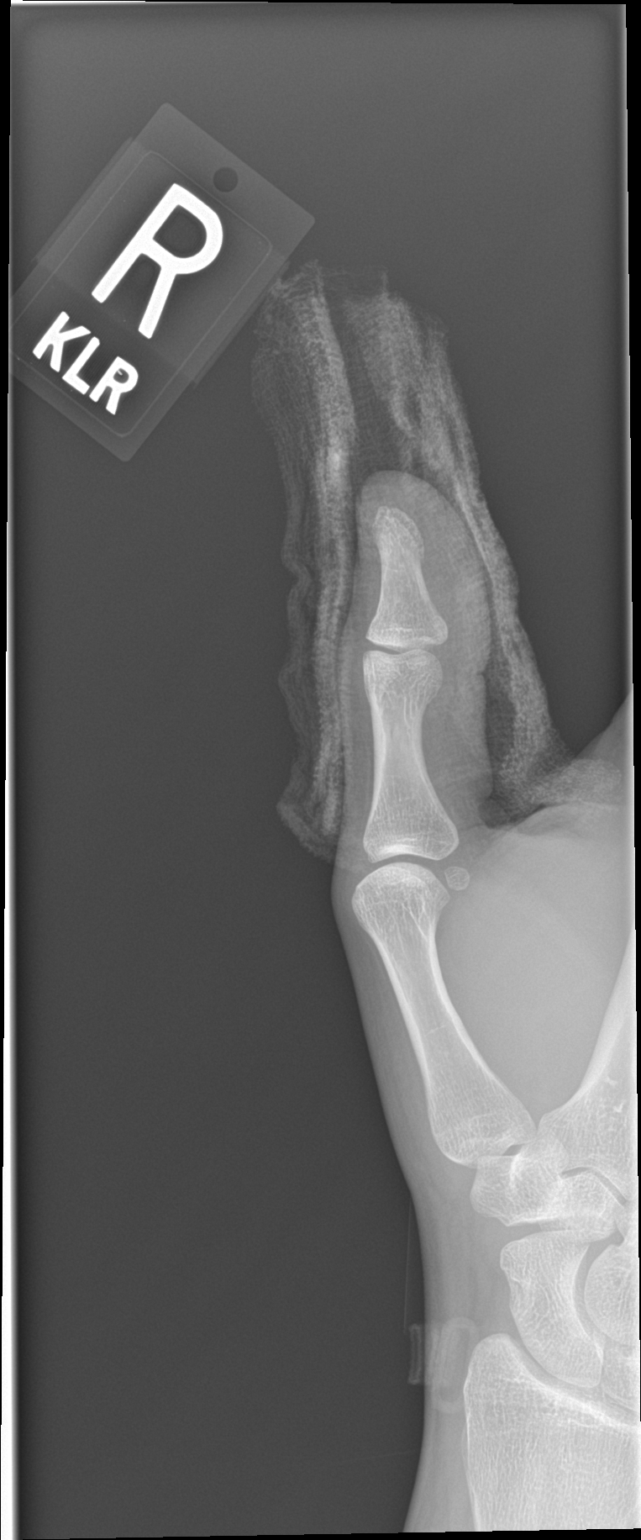

[finger lat]
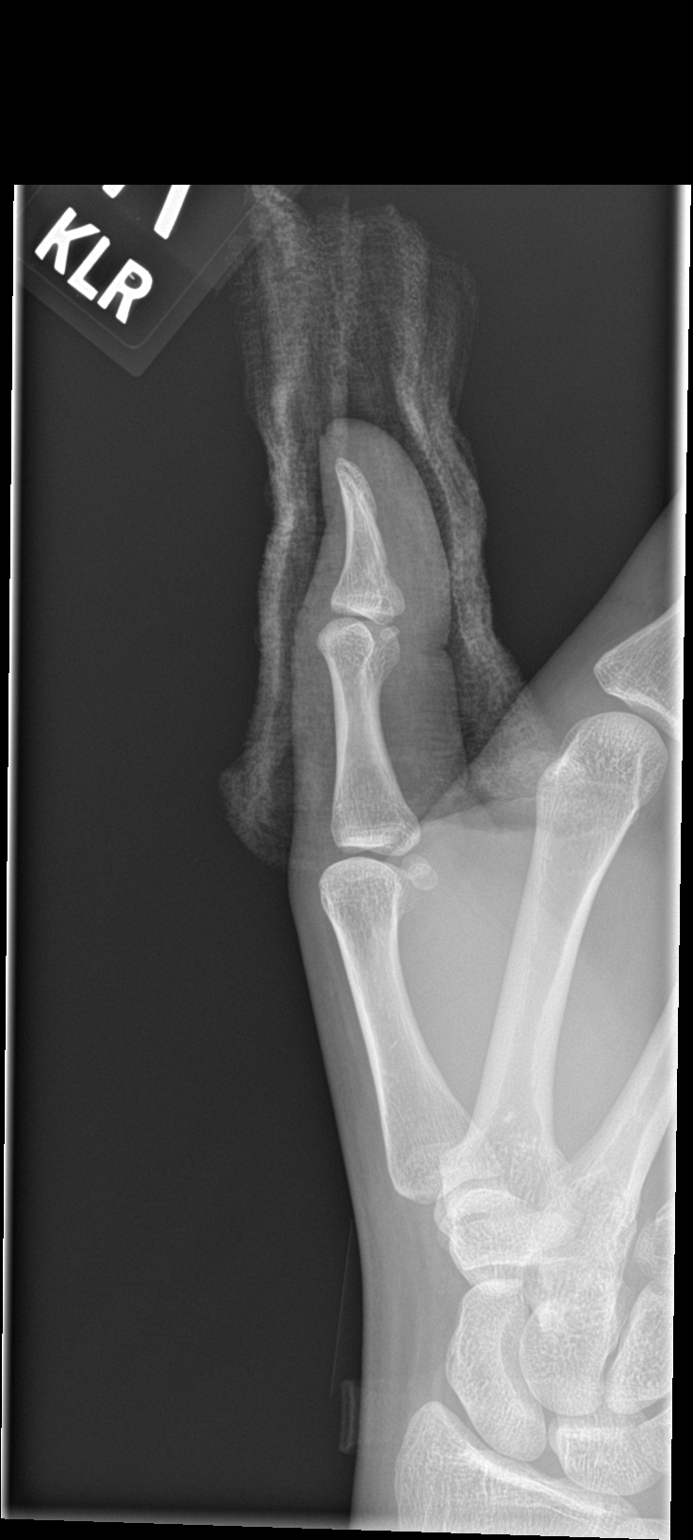

[3 of 3 positions shown; findings below may reference images not displayed]

FINDINGS: Frontal, oblique, and lateral views were obtained. There is
overlying bandage. No radiopaque foreign body apart from the
bandage. No demonstrable fracture or dislocation. Joint spaces
appear unremarkable. No erosive changes.
IMPRESSION: Overlying bandage. No fracture or dislocation. No appreciable
arthropathy.
